# Patient Record
Sex: Male | Born: 1985 | Race: Black or African American | Hispanic: No | Marital: Single | Smoking: Current every day smoker
Health system: Southern US, Community
[De-identification: ages and names within clinical notes are randomized; demographics above are authoritative.]

---
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
## 2007-05-04 ENCOUNTER — Emergency Department (HOSPITAL_COMMUNITY): Admission: EM | Admit: 2007-05-04 | Discharge: 2007-05-04 | Payer: Self-pay | Admitting: Emergency Medicine

## 2010-12-04 ENCOUNTER — Emergency Department (HOSPITAL_COMMUNITY)
Admission: EM | Admit: 2010-12-04 | Discharge: 2010-12-04 | Disposition: A | Discharge status: Home / Self Care | Payer: Self-pay | Source: Home / Self Care

## 2010-12-04 ENCOUNTER — Encounter: Payer: Self-pay | Admitting: Emergency Medicine

## 2010-12-04 ENCOUNTER — Emergency Department (HOSPITAL_COMMUNITY)
Admission: EM | Admit: 2010-12-04 | Discharge: 2010-12-04 | Disposition: A | Discharge status: Home / Self Care | Payer: Self-pay | Attending: Emergency Medicine | Admitting: Emergency Medicine

## 2010-12-04 DIAGNOSIS — R21 Rash and other nonspecific skin eruption: Secondary | ICD-10-CM | POA: Insufficient documentation

## 2010-12-04 DIAGNOSIS — L089 Local infection of the skin and subcutaneous tissue, unspecified: Secondary | ICD-10-CM | POA: Insufficient documentation

## 2010-12-04 DIAGNOSIS — B009 Herpesviral infection, unspecified: Secondary | ICD-10-CM | POA: Insufficient documentation

## 2010-12-04 MED ORDER — NAPROXEN 500 MG PO TABS: 500.0000 mg | ORAL_TABLET | Freq: Two times a day (BID) | ORAL | Status: AC

## 2010-12-04 MED ORDER — ACYCLOVIR 400 MG PO TABS: 400.0000 mg | ORAL_TABLET | Freq: Four times a day (QID) | ORAL | Status: AC

## 2010-12-04 NOTE — ED Notes (Signed)
Patient has area on his right pelvic/groin area that is painful to touch.  He states it feels like a bruise.  He denies any trauma.  Patient denies any diff voiding.  No other swollen or painful areas on patient at this time

## 2010-12-04 NOTE — ED Notes (Signed)
Knot on rt hip x 3 days  Red and swollen

## 2010-12-04 NOTE — ED Provider Notes (Signed)
History     CSN: [id number] Arrival date & time: 12/04/2010 11:55 AM   First MD Initiated Contact with Patient 12/04/10 1252      Chief Complaint  Patient presents with  . Wound Infection    (Consider location/radiation/quality/duration/timing/severity/associated sxs/prior treatment) HPI Patient has noted a swollen area in his right groin area for 3 days. Patient states it's been sore and tender to the touch. He has not noticed any fevers. There's been no dysuria or penile drainage. Patient denies any abdominal pain nausea or vomiting. Patient states around the same time he also noticed a rash on the right upper buttock region. Patient states it somewhat itchy and painful. Symptoms have been constant. He been moderate in nature. Nothing seems to make it better or worse other than palpation of the area making it worse. History reviewed. No pertinent past medical history.  History reviewed. No pertinent past surgical history.  No family history on file.  History  Substance Use Topics  . Smoking status: Current Everyday Smoker  . Smokeless tobacco: Not on file  . Alcohol Use: No      Review of Systems  All other systems reviewed and are negative.    Allergies  Review of patient's allergies indicates no known allergies.  Home Medications   None  BP 103/63  Pulse 66  Temp(Src) 97.5 F (36.4 C) (Oral)  Resp 22  SpO2 100%  Physical Exam  Nursing note and vitals reviewed. Constitutional: He appears well-developed and well-nourished. No distress.  HENT:  Head: Normocephalic and atraumatic.  Right Ear: External ear normal.  Left Ear: External ear normal.  Eyes: Conjunctivae are normal. Right eye exhibits no discharge. Left eye exhibits no discharge. No scleral icterus.  Neck: Neck supple. No tracheal deviation present.  Cardiovascular: Normal rate and regular rhythm.   No murmur heard. Pulmonary/Chest: Effort normal. No stridor. No respiratory distress.  Abdominal:  He exhibits no distension. There is no tenderness. There is no rebound.       Right inguinal region small area of lymphadenopathy no fluctuance  Musculoskeletal: He exhibits no edema.  Neurological: He is alert. Cranial nerve deficit: no gross deficits.  Skin: Skin is warm and dry. Rash noted.       Cluster of vesicles and a few centimeter patch in the right upper buttock region  Psychiatric: He has a normal mood and affect.    ED Course  Procedures (including critical care time)  Labs Reviewed - No data to display No results found.   1. Herpes simplex       MDM  Appears consistent with herpes simplex-type rash. Patient denies any genital symptoms. The lump is actually lymph node. There is no evidence of hernia or abscess. Patient is given a prescription for acyclovir and Naprosyn. He is encouraged to follow up with primary care Dr.       Celene Kras, MD 12/04/10 1304

## 2016-10-10 ENCOUNTER — Emergency Department (HOSPITAL_COMMUNITY): Payer: Self-pay

## 2016-10-10 ENCOUNTER — Emergency Department (HOSPITAL_COMMUNITY)
Admission: EM | Admit: 2016-10-10 | Discharge: 2016-10-10 | Disposition: A | Discharge status: Home / Self Care | Payer: Self-pay | Attending: Emergency Medicine | Admitting: Emergency Medicine

## 2016-10-10 ENCOUNTER — Encounter (HOSPITAL_COMMUNITY): Payer: Self-pay | Admitting: Emergency Medicine

## 2016-10-10 DIAGNOSIS — Z23 Encounter for immunization: Secondary | ICD-10-CM | POA: Insufficient documentation

## 2016-10-10 DIAGNOSIS — F17200 Nicotine dependence, unspecified, uncomplicated: Secondary | ICD-10-CM | POA: Insufficient documentation

## 2016-10-10 DIAGNOSIS — B351 Tinea unguium: Secondary | ICD-10-CM | POA: Insufficient documentation

## 2016-10-10 DIAGNOSIS — L03031 Cellulitis of right toe: Secondary | ICD-10-CM | POA: Insufficient documentation

## 2016-10-10 MED ORDER — CEPHALEXIN 500 MG PO CAPS: ORAL_CAPSULE | ORAL | 0 refills | Status: DC

## 2016-10-10 MED ORDER — HYDROCODONE-ACETAMINOPHEN 5-325 MG PO TABS
1.0000 | ORAL_TABLET | Freq: Once | ORAL | Status: AC
  Administered 2016-10-10: 1 via ORAL
  Filled 2016-10-10: qty 1

## 2016-10-10 MED ORDER — TETANUS-DIPHTH-ACELL PERTUSSIS 5-2.5-18.5 LF-MCG/0.5 IM SUSP
0.5000 mL | Freq: Once | INTRAMUSCULAR | Status: AC
  Administered 2016-10-10: 0.5 mL via INTRAMUSCULAR
  Filled 2016-10-10: qty 0.5

## 2016-10-10 NOTE — ED Notes (Signed)
Pt removed all wires.

## 2016-10-10 NOTE — Discharge Instructions (Signed)
Take the antibiotic Keflex as directed, and until completed. Perform warm soapy water soaks in Half-strength hydrogen peroxide mixed with warm soapy water for 5-10 mins, 2-4 times daily for 5-7 days. Alternate between tylenol and motrin as needed for pain. Ice and elevate to help with pain and swelling. Keep the area clean and dry, and covered with topical antibiotic ointment and bandage. Wear post-op shoe for comfort. Please followup with the urgent care for wound check in 2-3 days. Follow up with the [facility] in 1 week for recheck and to establish medical care; also follow up with the podiatrist in 1-2 weeks for recheck of symptoms and ongoing management of your toenail issues. Watch for increasing pain, worsening swelling, spreading redness, drainage/pus, or fever, and return to the emergency department if any of these symptoms occur, or for any other changes/worsening.

## 2016-10-10 NOTE — ED Notes (Signed)
Patient left at this time with all belongings. 

## 2016-10-10 NOTE — ED Notes (Signed)
Continuing to soak foot in sterile water and betadine. Awaiting PA for I&D.

## 2016-10-10 NOTE — ED Provider Notes (Signed)
[facility]-EMERGENCY DEPT Provider Note   CSN: [id number] Arrival date & time: 10/10/16  0346     History   Chief Complaint Chief Complaint  Patient presents with  . Toe Pain    HPI Lance Russell is a 31 y.o. male with a PMHx of growth hormone deficiency, who presents to the ED with complaints of right middle toe pain that began Thursday night 1.5 days prior to evaluation. Patient states that he has had "a lot of issues with his toes for a while" but hasn't seen anyone for it. States that he accidentally scraped it on the cement ground Thursday night and this caused his nail to bend back. He attempted to cut the nail off and this made his pain gradually get worse, and caused some swelling and bruising. He describes his pain as 8/10 intermittent throbbing right middle toe pain that radiates into the foot, worse with hanging his foot down with gravity, improved mildly with walking, and with no treatments tried prior to arrival. He is not on any blood thinners. He is unsure of his last tetanus shot. He denies any other medical problems aside from growth hormone deficiency, denies having any immunocompromising conditions. He does not currently have a PCP due to lack of insurance. He also wants a referral to podiatry for his long-standing toenail issues. He denies any red streaking, erythema, warmth, drainage, or ongoing bleeding from the toe. He also denies any fevers, chills, CP, SOB, abd pain, N/V/D/C, hematuria, dysuria, numbness, tingling, focal weakness, or any other complaints at this time.    The history is provided by the patient and medical records. No language interpreter was used.  Toe Pain  This is a new problem. The current episode started 2 days ago (Thursday night). The problem occurs daily. The problem has been gradually worsening. Pertinent negatives include no chest pain, no abdominal pain and no shortness of breath. Exacerbated by: laying down with gravity. The symptoms are relieved  by walking. He has tried nothing for the symptoms. The treatment provided no relief.    History reviewed. No pertinent past medical history.  There are no active problems to display for this patient.   History reviewed. No pertinent surgical history.     Home Medications    Prior to Admission medications   Not on File    Family History No family history on file.  Social History Social History  Substance Use Topics  . Smoking status: Current Every Day Smoker  . Smokeless tobacco: Never Used  . Alcohol use No     Allergies   Patient has no known allergies.   Review of Systems Review of Systems  Constitutional: Negative for chills and fever.  Respiratory: Negative for shortness of breath.   Cardiovascular: Negative for chest pain.  Gastrointestinal: Negative for abdominal pain, constipation, diarrhea, nausea and vomiting.  Genitourinary: Negative for dysuria and hematuria.  Musculoskeletal: Positive for arthralgias and joint swelling.  Skin: Positive for color change and wound.  Allergic/Immunologic: Negative for immunocompromised state.  Neurological: Negative for weakness and numbness.  Hematological: Does not bruise/bleed easily.  Psychiatric/Behavioral: Negative for confusion.  All other systems reviewed and are negative for acute change except as noted in the HPI.     Physical Exam Updated Vital Signs BP 124/88 (BP Location: Right Arm)   Pulse 60   Temp 98.1 F (36.7 C)   Resp 16   SpO2 100%   Physical Exam  Constitutional: He is oriented to person, place, and  time. Vital signs are normal. He appears well-developed and well-nourished.  Non-toxic appearance. No distress.  Afebrile, nontoxic, NAD  HENT:  Head: Normocephalic and atraumatic.  Mouth/Throat: Mucous membranes are normal.  Eyes: Conjunctivae and EOM are normal. Right eye exhibits no discharge. Left eye exhibits no discharge.  Neck: Normal range of motion. Neck supple.  Cardiovascular:  Normal rate and intact distal pulses.   Pulmonary/Chest: Effort normal. No respiratory distress.  Abdominal: Normal appearance. He exhibits no distension.  Musculoskeletal: Normal range of motion.       Right foot: There is tenderness and swelling. There is normal range of motion, normal capillary refill, no crepitus, no deformity and no laceration.  Onychomycosis of toenails, hypertrophied thickened hardened yellowed nails. R middle toe with mild swelling and small pocket of fluctuance around nail edge, mild bruising, mild TTP, no erythema/warmth/red streaking, no drainage from the area, no lacerations/abrasions noted, top layers of the nail of this toe appear to be removed but bottom layers intact, and nailbed covered by nail that remains. No focal tenderness to remainder of foot. No crepitus or deformity, FROM intact in all joints of the toe. Strength and sensation grossly intact, distal pulses intact, soft compartments.   Neurological: He is alert and oriented to person, place, and time. He has normal strength. No sensory deficit.  Skin: Skin is warm, dry and intact. No rash noted.  Psychiatric: He has a normal mood and affect.  Nursing note and vitals reviewed.    ED Treatments / Results  Labs (all labs ordered are listed, but only abnormal results are displayed) Labs Reviewed - No data to display  EKG  EKG Interpretation None       Radiology No results found.  Procedures Drain paronychia Date/Time: 10/10/2016 6:27 AM Performed by: Rhona Raider Authorized by: Rhona Raider  Consent: Verbal consent obtained. Risks and benefits: risks, benefits and alternatives were discussed Consent given by: patient Patient understanding: patient states understanding of the procedure being performed Patient consent: the patient's understanding of the procedure matches consent given Patient identity confirmed: verbally with patient Local anesthesia used: no  Anesthesia: Local  anesthesia used: no  Sedation: Patient sedated: no Patient tolerance: Patient tolerated the procedure well with no immediate complications Comments: Soaked in water/betadine solution until skin softened, gently pushed skin of nail fold back using 18G needle until paronychia drained    (including critical care time)  Medications Ordered in ED Medications  Tdap (BOOSTRIX) injection 0.5 mL (0.5 mLs Intramuscular Given 10/10/16 0514)  HYDROcodone-acetaminophen (NORCO/VICODIN) 5-325 MG per tablet 1 tablet (1 tablet Oral Given 10/10/16 0514)     Initial Impression / Assessment and Plan / ED Course  I have reviewed the triage vital signs and the nursing notes.  Pertinent labs & imaging results that were available during my care of the patient were reviewed by me and considered in my medical decision making (see chart for details).     30 y.o. male here with R middle toe pain and swelling after he scraped it on the cement 1.5days ago and then tried to remove the nail that had gotten "bent". On exam, onychomycosis of nails, hardened hypertrophic yellowed nails, R middle toe with mild swelling around nail with small pocket of fluctuance, top portion of nail almost completely removed but underneath nail still remains on the nail bed; no lacerations, mild bruising, no erythema or warmth, no red streaking. FROM intact, soft compartments, NVI, mild TTP to distal toe. Appears as though he removed top  layer of hypertrophied nail but still left the underlying portion, and has a small paronychia. Will get xray, update Tdap, and soak foot in attempt to drain the paronychia. Will give pain meds and reassess shortly  6:30 AM Xray negative. After foot soak in water/betadine solution, skin near paronychia gently pushed back using 18G needle and successfully drained moderate amount of purulent drainage; skin gently peeled up away from nail in this area to allow for ongoing drainage. Doubt need for incision or  further deeper exploration. Will apply dressing and post-op shoe for comfort, will start on abx, advised tylenol/motrin for pain. Ice/elevation advised. Foot soaks with warm soapy water and half-strength peroxide advised. Will not start on antifungal meds since we would need baseline LFTs/labs, and this case will likely require longer duration of therapy to clear it up; will refer to [facility] and podiatry to establish medical care in 1-2wks and for ongoing management of this longstanding issue. Advised f/up with [facility] in 2-3 days for recheck of paronychia. Proper wound care advised. I explained the diagnosis and have given explicit precautions to return to the ER including for any other new or worsening symptoms. The patient understands and accepts the medical plan as it's been dictated and I have answered their questions. Discharge instructions concerning home care and prescriptions have been given. The patient is STABLE and is discharged to home in good condition.     Final Clinical Impressions(s) / ED Diagnoses   Final diagnoses:  Paronychia of third toe, right  Onychomycosis of toenail    New Prescriptions New Prescriptions   CEPHALEXIN (KEFLEX) 500 MG CAPSULE    2 caps po bid x 7 days     [address] [place], [place] 10/10/16 0631    Azalia Bilis, MD 10/10/16 [phone]

## 2016-10-10 NOTE — ED Notes (Signed)
PA at bedside.

## 2016-10-10 NOTE — ED Triage Notes (Signed)
Report from [facility]> pt states he scraped his R middle toe on cement yesterday and damaged nail.  C/o pain and swelling.

## 2017-02-03 ENCOUNTER — Encounter (HOSPITAL_COMMUNITY): Payer: Self-pay | Admitting: Emergency Medicine

## 2017-02-03 ENCOUNTER — Other Ambulatory Visit: Payer: Self-pay

## 2017-02-03 DIAGNOSIS — F17200 Nicotine dependence, unspecified, uncomplicated: Secondary | ICD-10-CM | POA: Insufficient documentation

## 2017-02-03 DIAGNOSIS — R0789 Other chest pain: Secondary | ICD-10-CM | POA: Insufficient documentation

## 2017-02-03 NOTE — ED Triage Notes (Signed)
Pt presents with pain to the R ribs that he describes as a cramping that causes increased pain with deep breathing; pt denies any strenuous exercise or heavy lifting; pt denies CP, n/v, diaphoresis

## 2017-02-04 ENCOUNTER — Emergency Department (HOSPITAL_COMMUNITY): Payer: Self-pay

## 2017-02-04 ENCOUNTER — Emergency Department (HOSPITAL_COMMUNITY)
Admission: EM | Admit: 2017-02-04 | Discharge: 2017-02-04 | Disposition: A | Discharge status: Home / Self Care | Payer: Self-pay | Attending: Emergency Medicine | Admitting: Emergency Medicine

## 2017-02-04 DIAGNOSIS — R0789 Other chest pain: Secondary | ICD-10-CM

## 2017-02-04 LAB — BASIC METABOLIC PANEL
ANION GAP: 7 (ref 5–15)
BUN: 10 mg/dL (ref 6–20)
CHLORIDE: 104 mmol/L (ref 101–111)
CO2: 24 mmol/L (ref 22–32)
Calcium: 9 mg/dL (ref 8.9–10.3)
Creatinine, Ser: 0.83 mg/dL (ref 0.61–1.24)
GFR calc non Af Amer: >60 mL/min (ref >60)
Glucose, Bld: 89 mg/dL (ref 65–99)
POTASSIUM: 4 mmol/L (ref 3.5–5.1)
Sodium: 135 mmol/L (ref 135–145)

## 2017-02-04 LAB — CBC
HEMATOCRIT: 40.2 % (ref 39.0–52.0)
HEMOGLOBIN: 12.9 g/dL — AB (ref 13.0–17.0)
MCH: 26.5 pg (ref 26.0–34.0)
MCHC: 32.1 g/dL (ref 30.0–36.0)
MCV: 82.7 fL (ref 78.0–100.0)
Platelets: 175 10*3/uL (ref 150–400)
RBC: 4.86 MIL/uL (ref 4.22–5.81)
RDW: 14.7 % (ref 11.5–15.5)
WBC: 5.9 10*3/uL (ref 4.0–10.5)

## 2017-02-04 LAB — I-STAT TROPONIN, ED: Troponin i, poc: 0 ng/mL (ref 0.00–0.08)

## 2017-02-04 MED ORDER — CYCLOBENZAPRINE HCL 10 MG PO TABS: 10.0000 mg | ORAL_TABLET | Freq: Two times a day (BID) | ORAL | 0 refills | Status: DC | PRN

## 2017-02-04 MED ORDER — IBUPROFEN 800 MG PO TABS
800.0000 mg | ORAL_TABLET | Freq: Three times a day (TID) | ORAL | 0 refills | Status: DC
Start: 2017-02-04 — End: 2020-08-29

## 2017-02-04 NOTE — ED Provider Notes (Signed)
[facility] EMERGENCY DEPARTMENT Provider Note   CSN: [id number] Arrival date & time: 02/03/17  2344     History   Chief Complaint Chief Complaint  Patient presents with  . Chest Pain  . Shortness of Breath    HPI Lance Russell is a 32 y.o. male.  Patient presents to the ED with a chief complaint of right sided chest pain.  He states that the symptoms started yesterday.  He reports pain with palpation and deep breathing.  He denies any recent illness, fever, cough, SOB, n/v/d.  He has not taken anything for the symptoms.  Denies any other medical problems.  Denies any injuries.   The history is provided by the patient. No language interpreter was used.    History reviewed. No pertinent past medical history.  There are no active problems to display for this patient.   History reviewed. No pertinent surgical history.     Home Medications    Prior to Admission medications   Medication Sig Start Date End Date Taking? Authorizing Provider  cephALEXin (KEFLEX) 500 MG capsule 2 caps po bid x 7 days 10/10/16   Street, [place], [place]    Family History History reviewed. No pertinent family history.  Social History Social History   Tobacco Use  . Smoking status: Current Every Day Smoker  . Smokeless tobacco: Never Used  Substance Use Topics  . Alcohol use: No  . Drug use: No     Allergies   Patient has no known allergies.   Review of Systems Review of Systems  All other systems reviewed and are negative.    Physical Exam Updated Vital Signs BP 104/64 (BP Location: Right Arm)   Pulse 63   Temp 98.8 F (37.1 C) (Oral)   Resp 16   Ht 5\' 1"  (1.549 m)   SpO2 99%   Physical Exam  Constitutional: He is oriented to person, place, and time. He appears well-developed and well-nourished.  HENT:  Head: Normocephalic and atraumatic.  Eyes: Conjunctivae and EOM are normal. Pupils are equal, round, and reactive to light. Right eye exhibits no  discharge. Left eye exhibits no discharge. No scleral icterus.  Neck: Normal range of motion. Neck supple. No JVD present.  Cardiovascular: Normal rate, regular rhythm and normal heart sounds. Exam reveals no gallop and no friction rub.  No murmur heard. Pulmonary/Chest: Effort normal and breath sounds normal. No respiratory distress. He has no wheezes. He has no rales. He exhibits no tenderness.  Abdominal: Soft. He exhibits no distension and no mass. There is no tenderness. There is no rebound and no guarding.  Musculoskeletal: Normal range of motion. He exhibits no edema or tenderness.  Neurological: He is alert and oriented to person, place, and time.  Skin: Skin is warm and dry.  Psychiatric: He has a normal mood and affect. His behavior is normal. Judgment and thought content normal.  Nursing note and vitals reviewed.    ED Treatments / Results  Labs (all labs ordered are listed, but only abnormal results are displayed) Labs Reviewed  CBC - Abnormal; Notable for the following components:      Result Value   Hemoglobin 12.9 (*)    All other components within normal limits  BASIC METABOLIC PANEL  I-STAT TROPONIN, ED    EKG  EKG Interpretation None      ED ECG REPORT  I personally interpreted this EKG   Date: 02/04/2017   Rate: 64  Rhythm: normal sinus rhythm  QRS Axis: normal  Intervals: normal  ST/T Wave abnormalities: normal  Conduction Disutrbances:none  Narrative Interpretation:   Old EKG Reviewed: none available   Radiology Dg Chest 2 View  Result Date: 02/04/2017 CLINICAL DATA:  32 year old male with shortness of breath. EXAM: CHEST  2 VIEW COMPARISON:  None. FINDINGS: The heart size and mediastinal contours are within normal limits. Both lungs are clear. The visualized skeletal structures are unremarkable. IMPRESSION: No active cardiopulmonary disease. Electronically Signed   By: Elgie CollardArash  Radparvar M.D.   On: 02/04/2017 02:23    Procedures Procedures  (including critical care time)  Medications Ordered in ED Medications - No data to display   Initial Impression / Assessment and Plan / ED Course  I have reviewed the triage vital signs and the nursing notes.  Pertinent labs & imaging results that were available during my care of the patient were reviewed by me and considered in my medical decision making (see chart for details).     Patient presents with chest pain x 1 day, it is worse with movement and palpation.   DDx includes ACS, PE, pneumothorax, aortic dissection, esophageal rupture, pericarditis, chest wall pain.  Doubt ACS, normal troponin, no ischemic EKG findings, HEART score is: 0.  Low risk for PE, PERC negative, patient is not tachycardic nor hypoxic.  No evidence of pneumothorax on CXR.  Doubt dissection, no mediastinal widening on CXR, no ripping/tearing chest pain, neurovascularly intact.  Doubt pericarditis, no positional changes, or diffuse ST elevations on EKG.  Pain is not reproducible, doubt MSK.  Recommend close follow-up with PCP.    All findings were discussed with patient.  Patient understands and agrees with the plan.     Final Clinical Impressions(s) / ED Diagnoses   Final diagnoses:  Chest wall pain    ED Discharge Orders        Ordered    ibuprofen (ADVIL,MOTRIN) 800 MG tablet  3 times daily     02/04/17 0416    cyclobenzaprine (FLEXERIL) 10 MG tablet  2 times daily PRN     02/04/17 0416       Roxy HorsemanBrowning, Hailyn Zarr, PA-C 02/04/17 0416    Dione BoozeGlick, David, MD 02/04/17 [phone]

## 2019-06-08 ENCOUNTER — Emergency Department (HOSPITAL_COMMUNITY)
Admission: EM | Admit: 2019-06-08 | Discharge: 2019-06-08 | Disposition: A | Discharge status: Home / Self Care | Payer: Self-pay | Attending: Emergency Medicine | Admitting: Emergency Medicine

## 2019-06-08 ENCOUNTER — Other Ambulatory Visit: Payer: Self-pay

## 2019-06-08 ENCOUNTER — Encounter (HOSPITAL_COMMUNITY): Payer: Self-pay | Admitting: Pediatrics

## 2019-06-08 DIAGNOSIS — L249 Irritant contact dermatitis, unspecified cause: Secondary | ICD-10-CM | POA: Insufficient documentation

## 2019-06-08 DIAGNOSIS — F17200 Nicotine dependence, unspecified, uncomplicated: Secondary | ICD-10-CM | POA: Insufficient documentation

## 2019-06-08 DIAGNOSIS — R21 Rash and other nonspecific skin eruption: Secondary | ICD-10-CM

## 2019-06-08 MED ORDER — PREDNISONE 10 MG PO TABS: 40.0000 mg | ORAL_TABLET | Freq: Every day | ORAL | 0 refills | Status: DC

## 2019-06-08 MED ORDER — CETIRIZINE HCL 5 MG PO TABS: 5.0000 mg | ORAL_TABLET | Freq: Every day | ORAL | 0 refills | Status: DC

## 2019-06-08 NOTE — ED Provider Notes (Signed)
[facility] EMERGENCY DEPARTMENT Provider Note   CSN: [id number] Arrival date & time: 06/08/19  1527     History Chief Complaint  Patient presents with  . Rash    Lance Russell is a 34 y.o. male.  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  He states it has been intermittent since.  It seems to come and go and is associated with small bumps that are itchy.  He states that he has been scratching significantly on his left forearm and has scratched a small lesion on his arm.  Patient has any fever, chills, nausea, vomiting denies any wheezing, shortness of breath or chest pain.  Patient works in Therapist, [employer] care and states that he wears shorts and short sleeve shirts when he is working.  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  States that he has no known exposure to poison ivy/poison oak.     History reviewed. No pertinent past medical history.  There are no problems to display for this patient.   History reviewed. No pertinent surgical history.     No family history on file.  Social History   Tobacco Use  . Smoking status: Current Every Day Smoker  . Smokeless tobacco: Never Used  Substance Use Topics  . Alcohol use: No  . Drug use: No    Home Medications Prior to Admission medications   Medication Sig Start Date End Date Taking? Authorizing Provider  cephALEXin (KEFLEX) 500 MG capsule 2 caps po bid x 7 days 10/10/16   Street, [place], PA-C  cetirizine (ZYRTEC) 5 MG tablet Take 1 tablet (5 mg total) by mouth daily for 7 days. 06/08/19 06/15/19  Gailen Shelter, PA  cyclobenzaprine (FLEXERIL) 10 MG tablet Take 1 tablet (10 mg total) by mouth 2 (two) times daily as needed for muscle spasms. 02/04/17   Roxy Horseman, PA-C  ibuprofen (ADVIL,MOTRIN) 800 MG  tablet Take 1 tablet (800 mg total) by mouth 3 (three) times daily. 02/04/17   Roxy Horseman, PA-C  predniSONE (DELTASONE) 10 MG tablet Take 4 tablets (40 mg total) by mouth daily. 06/08/19   Gailen Shelter, PA    Allergies    Patient has no known allergies.  Review of Systems   Review of Systems  Constitutional: Negative for chills and fever.  HENT: Negative for congestion.   Respiratory: Negative for shortness of breath.   Cardiovascular: Negative for chest pain.  Gastrointestinal: Negative for abdominal pain.  Musculoskeletal: Negative for neck pain.  Skin: Positive for rash.    Physical Exam Updated Vital Signs BP 114/75   Pulse 71   Temp 98.4 F (36.9 C) (Oral)   Resp 14   Ht 5\' 2"  (1.575 m)   SpO2 100%   Physical Exam Vitals and nursing note reviewed.  Constitutional:      General: He is not in acute distress.    Appearance: Normal appearance. He is not ill-appearing.  HENT:     Head: Normocephalic and atraumatic.  Eyes:     General: No scleral icterus.       Right eye: No discharge.        Left eye: No discharge.     Conjunctiva/sclera: Conjunctivae normal.  Pulmonary:     Effort: Pulmonary effort is normal.  Breath sounds: No stridor.  Skin:    General: Skin is warm and dry.     Comments: Diffuse small papules to the bilateral forearms. No rash noted to the scalp, neck or legs.  Neurological:     Mental Status: He is alert and oriented to person, place, and time. Mental status is at baseline.     ED Results / Procedures / Treatments   Labs (all labs ordered are listed, but only abnormal results are displayed) Labs Reviewed - No data to display  EKG None  Radiology No results found.  Procedures Procedures (including critical care time)  Medications Ordered in ED Medications - No data to display  ED Course  I have reviewed the triage vital signs and the nursing notes.  Pertinent labs & imaging results that were available during my  care of the patient were reviewed by me and considered in my medical decision making (see chart for details).    MDM Rules/Calculators/A&P                      Patient is well-appearing 34 year old male with benign-appearing papular rash on the forearms with itching of the forearms and the back of the neck.  He works in Teacher, adult education care and states that it has come and gone over the past week seems to be worse after he works.  Suspect that this is irritant dermatitis.  He has no known change in household allergens such as soaps, detergents, animals.  Will provide patient with 5 days of prednisone, recommend Benadryl and cetirizine.  Patient will follow up with [place] and wellness clinic.  To self-care.  No evidence of bacterial superinfection.  No evidence of wound infection.  Doubt fungal or bacterial infection.  Doubt viral exanthem.  Patient given return precautions.  He is agreeable to plan and will follow up with PCP.    Final Clinical Impression(s) / ED Diagnoses Final diagnoses:  Rash  Irritant contact dermatitis, unspecified trigger    Rx / DC Orders ED Discharge Orders         Ordered    cetirizine (ZYRTEC) 5 MG tablet  Daily     06/08/19 1653    predniSONE (DELTASONE) 10 MG tablet  Daily     06/08/19 [place], Darvis Croft [place], [place] 06/08/19 1654    Carmin Muskrat, MD 06/08/19 2349

## 2019-06-08 NOTE — ED Triage Notes (Signed)
C/O itchy rash all over body; comes and go x 1 week.

## 2019-06-08 NOTE — Discharge Instructions (Addendum)
Please call to make an appointment with the [facility] health and wellness clinic.  I have attached information for you.  Please take Benadryl 25-50 mg every 6 hours.  I recommend that you start with 50 mg and taper down to 25 mg every 6 hours.  Please wear protective long sleeve shirt and pants.

## 2019-06-08 NOTE — ED Notes (Signed)
Patient going to car and will return back shortly

## 2019-12-12 ENCOUNTER — Encounter (HOSPITAL_COMMUNITY): Payer: Self-pay | Admitting: Emergency Medicine

## 2019-12-12 ENCOUNTER — Other Ambulatory Visit: Payer: Self-pay

## 2019-12-12 ENCOUNTER — Emergency Department (HOSPITAL_COMMUNITY)
Admission: EM | Admit: 2019-12-12 | Discharge: 2019-12-12 | Disposition: A | Discharge status: Home / Self Care | Payer: Self-pay | Attending: Emergency Medicine | Admitting: Emergency Medicine

## 2019-12-12 ENCOUNTER — Emergency Department (HOSPITAL_COMMUNITY): Payer: Self-pay

## 2019-12-12 DIAGNOSIS — F17200 Nicotine dependence, unspecified, uncomplicated: Secondary | ICD-10-CM | POA: Insufficient documentation

## 2019-12-12 DIAGNOSIS — M79672 Pain in left foot: Secondary | ICD-10-CM

## 2019-12-12 NOTE — ED Provider Notes (Signed)
[facility] EMERGENCY DEPARTMENT Provider Note   CSN: [id number] Arrival date & time: 12/12/19  1854     History Chief Complaint  Patient presents with  . Foot Pain    Lance Russell is a 34 y.o. male with no significant past medical history presents to the ED for evaluation of left foot pain for the last 2 to 3 weeks.  Described as a "tightness" and throbbing pain on the top of his foot around the third and fourth toes associated with swelling.  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  Does not specifically remember dropping anything on his feet or any injuries but states he "may" have twisted his foot at work and just does not remember.  No interventions.  Pain is worse with weightbearing and palpation.  No radiation of pain up the calf or leg.  No associated calf swelling.  No redness, warmth, fever.  No history of gout. No history of diabetes.   HPI     History reviewed. No pertinent past medical history.  There are no problems to display for this patient.   History reviewed. No pertinent surgical history.     No family history on file.  Social History   Tobacco Use  . Smoking status: Current Every Day Smoker  . Smokeless tobacco: Never Used  Substance Use Topics  . Alcohol use: No  . Drug use: No    Home Medications Prior to Admission medications   Medication Sig Start Date End Date Taking? Authorizing Provider  cephALEXin (KEFLEX) 500 MG capsule 2 caps po bid x 7 days 10/10/16   Street, [place], PA-C  cetirizine (ZYRTEC) 5 MG tablet Take 1 tablet (5 mg total) by mouth daily for 7 days. 06/08/19 06/15/19  Gailen Shelter, PA  cyclobenzaprine (FLEXERIL) 10 MG tablet Take 1 tablet (10 mg total) by mouth 2 (two) times daily as needed for muscle spasms. 02/04/17   Roxy Horseman, PA-C  ibuprofen (ADVIL,MOTRIN) 800 MG tablet Take 1 tablet (800 mg total) by mouth 3 (three)  times daily. 02/04/17   Roxy Horseman, PA-C  predniSONE (DELTASONE) 10 MG tablet Take 4 tablets (40 mg total) by mouth daily. 06/08/19   Gailen Shelter, PA    Allergies    Patient has no known allergies.  Review of Systems   Review of Systems  Musculoskeletal: Positive for arthralgias and joint swelling.  All other systems reviewed and are negative.   Physical Exam Updated Vital Signs BP 124/73 (BP Location: Left Arm)   Pulse 74   Temp 98.3 F (36.8 C) (Oral)   Resp 16   Ht 5' (1.524 m)   Wt 75 kg   SpO2 100%   BMI 32.29 kg/m   Physical Exam Constitutional:      Appearance: He is well-developed.  HENT:     Head: Normocephalic.     Nose: Nose normal.  Eyes:     General: Lids are normal.  Cardiovascular:     Rate and Rhythm: Normal rate.     Comments: 1+ DP and PT pulses LLE  Pulmonary:     Effort: Pulmonary effort is normal. No respiratory distress.  Musculoskeletal:        General: Normal range of motion.     Cervical back: Normal range of motion.  Skin:    Comments: Hyperpigmented dry peeling skin bilateral feet/toes.  Hyperpigmented and thickened toes bilaterally.  Skin between toes macerated but no erythema, warmth, discharge.  No abscess. Mild focal edema, tenderness over dorsal 3rd and 4th metatarsals. Patient with patient with toe flexion/extension.  Sole of foot with one callus but no wounds, abscess. No focal bony tenderness of ankles, calf. Calf supple and non tender, without edema   Neurological:     Mental Status: He is alert.     Comments: Sensation and strength intact in LLE  Psychiatric:        Behavior: Behavior normal.     ED Results / Procedures / Treatments   Labs (all labs ordered are listed, but only abnormal results are displayed) Labs Reviewed - No data to display  EKG None  Radiology DG Foot Complete Left  Result Date: 12/12/2019 CLINICAL DATA:  Pain EXAM: LEFT FOOT - COMPLETE 3+ VIEW COMPARISON:  None. FINDINGS: There is no  evidence of fracture or dislocation. There is no evidence of arthropathy or other focal bone abnormality. Soft tissues are unremarkable. IMPRESSION: Negative. Electronically Signed   By: Jonna Clark M.D.   On: 12/12/2019 20:11    Procedures Procedures (including critical care time)  Medications Ordered in ED Medications - No data to display  ED Course  I have reviewed the triage vital signs and the nursing notes.  Pertinent labs & imaging results that were available during my care of the patient were reviewed by me and considered in my medical decision making (see chart for details).    MDM Rules/Calculators/A&P                          Left foot pain and swelling over dorsal 3rd and 4th metatarsals. Patient reports he may have twisted his foot at work. Prolonged standing at work. No signs of infection on exam, wounds. Normal distal pulse/sensation. No abscess noted. Doubt gout. Not consistent with septic arthritis, DVT. X-ray obtained in triage personally visualized and interpreted - negative.  Discussed with patient may be soft tissue injury vs occult stress fracture given his job. Recommended NSAID, ice, cam walker and repeat x-rays/evaluation by podiatry/ortho or PCP.  Work note given.    2245: I went to discharge patient, he was frustrated due to wait time to be discharged. Explained at this time RN was busy with several other patients. He was pacing up and down hall. Asked him to sit but he raised his voice, swearing and insulting RN. Warned him that this behavior would not be tolerated here in the ER, asked him to sit back in his hall bed to wait for cam walker. He continued to be disrespectful to RN. Security escorted patient out.   Final Clinical Impression(s) / ED Diagnoses Final diagnoses:  Left foot pain    Rx / DC Orders ED Discharge Orders    None       Liberty Handy, PA-C 12/12/19 2247    Virgina Norfolk, DO 12/12/19 2301

## 2019-12-12 NOTE — ED Notes (Signed)
Pt escorted out by security

## 2019-12-12 NOTE — ED Triage Notes (Signed)
Patient reports left foot pain this week , denies injury /ambulatory , patient stated " it feels tight" with mild swelling .

## 2019-12-12 NOTE — Discharge Instructions (Addendum)
You were seen in the ER for foot pain  X-ray was normal  Cause of your pain is unclear, it may be inflammation from overuse or a lot of walking. We discussed there may be a small hairline or stress fracture on your foot.  Wear your cam walker at least for 5-7 days. Rest. Elevate the foot. Ice as much as possible. Alternate ibuprofen 600 mg or 1000 mg acetaminophen every 6 hours for the next 5 days.    You need to be seen by foot specialist for re-evaluation and possible repeat x-rays in the next 7 days  Return for worsening or new symptoms, redness, warmth, push, fevers, swelling or pain extending up to the calf or leg

## 2019-12-12 NOTE — ED Notes (Signed)
Patient threatening staff. Security called. Patient escorted off premises before discharge vitals could be obtained.

## 2019-12-15 ENCOUNTER — Other Ambulatory Visit: Payer: Self-pay

## 2019-12-15 ENCOUNTER — Ambulatory Visit (HOSPITAL_COMMUNITY)
Admission: EM | Admit: 2019-12-15 | Discharge: 2019-12-15 | Disposition: A | Discharge status: Home / Self Care | Payer: Self-pay

## 2019-12-16 ENCOUNTER — Encounter (HOSPITAL_COMMUNITY): Payer: Self-pay

## 2019-12-16 ENCOUNTER — Ambulatory Visit (HOSPITAL_COMMUNITY)
Admission: EM | Admit: 2019-12-16 | Discharge: 2019-12-16 | Disposition: A | Discharge status: Home / Self Care | Payer: Self-pay | Attending: Family Medicine | Admitting: Family Medicine

## 2019-12-16 ENCOUNTER — Other Ambulatory Visit: Payer: Self-pay

## 2019-12-16 ENCOUNTER — Ambulatory Visit (INDEPENDENT_AMBULATORY_CARE_PROVIDER_SITE_OTHER): Payer: Self-pay

## 2019-12-16 DIAGNOSIS — B070 Plantar wart: Secondary | ICD-10-CM

## 2019-12-16 DIAGNOSIS — M79672 Pain in left foot: Secondary | ICD-10-CM

## 2019-12-16 DIAGNOSIS — L03116 Cellulitis of left lower limb: Secondary | ICD-10-CM

## 2019-12-16 DIAGNOSIS — B353 Tinea pedis: Secondary | ICD-10-CM

## 2019-12-16 MED ORDER — KETOROLAC TROMETHAMINE 60 MG/2ML IM SOLN
60.0000 mg | Freq: Once | INTRAMUSCULAR | Status: AC
  Administered 2019-12-16: 60 mg via INTRAMUSCULAR

## 2019-12-16 MED ORDER — IBUPROFEN 800 MG PO TABS: 800.0000 mg | ORAL_TABLET | Freq: Three times a day (TID) | ORAL | 0 refills | Status: DC | PRN

## 2019-12-16 MED ORDER — KETOCONAZOLE 2 % EX CREA: 1.0000 "application " | TOPICAL_CREAM | Freq: Two times a day (BID) | CUTANEOUS | 0 refills | Status: DC | PRN

## 2019-12-16 MED ORDER — SULFAMETHOXAZOLE-TRIMETHOPRIM 800-160 MG PO TABS: 1.0000 | ORAL_TABLET | Freq: Two times a day (BID) | ORAL | 0 refills | Status: AC

## 2019-12-16 MED ORDER — KETOROLAC TROMETHAMINE 60 MG/2ML IM SOLN
INTRAMUSCULAR | Status: AC
  Filled 2019-12-16: qty 2

## 2019-12-16 NOTE — ED Triage Notes (Signed)
Pt presents with pain and swelling in the left foot. States the pain started in the left big toe. Pt denies any trauma. Pt reports he works in [employer] and is missing his work as he is not able to wear the boots because of the pain. Advil gives no relief. Pt wearing a post op shoes, relief pain,  his sister gave it to him.

## 2019-12-16 NOTE — ED Provider Notes (Signed)
[facility]    CSN: [id number] Arrival date & time: 12/16/19  1010      History   Chief Complaint Chief Complaint  Patient presents with  . Foot Pain    HPI Lance Russell is a 34 y.o. male.   Presenting today for 1 week hx of progressively worsening swelling, redness and pain of left foot at base of 1-3rd toes extending now into metatarsals. 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 Denies fever, chills, sweats, swelling or pain up into leg, numbness or tingling of foot. Has tried OTC pain relievers with some relief. Notes his boots for work make the pain significantly worse so he's been unable to work recently.      History reviewed. No pertinent past medical history.  There are no problems to display for this patient.   History reviewed. No pertinent surgical history.     Home Medications    Prior to Admission medications   Medication Sig Start Date End Date Taking? Authorizing Provider  cephALEXin (KEFLEX) 500 MG capsule 2 caps po bid x 7 days 10/10/16   Street, [place], PA-C  cetirizine (ZYRTEC) 5 MG tablet Take 1 tablet (5 mg total) by mouth daily for 7 days. 06/08/19 06/15/19  Gailen Shelter, PA  cyclobenzaprine (FLEXERIL) 10 MG tablet Take 1 tablet (10 mg total) by mouth 2 (two) times daily as needed for muscle spasms. 02/04/17   Roxy Horseman, PA-C  ibuprofen (ADVIL) 800 MG tablet Take 1 tablet (800 mg total) by mouth every 8 (eight) hours as needed. 12/16/19   Particia Nearing, PA-C  ibuprofen (ADVIL,MOTRIN) 800 MG tablet Take 1 tablet (800 mg total) by mouth 3 (three) times daily. 02/04/17   Roxy Horseman, PA-C  ketoconazole (NIZORAL) 2 % cream Apply 1 application topically 2 (two) times daily as needed for irritation. 12/16/19   Particia Nearing, PA-C  predniSONE (DELTASONE) 10 MG tablet  Take 4 tablets (40 mg total) by mouth daily. 06/08/19   Gailen Shelter, PA  sulfamethoxazole-trimethoprim (BACTRIM DS) 800-160 MG tablet Take 1 tablet by mouth 2 (two) times daily for 7 days. 12/16/19 12/23/19  Particia Nearing, PA-C    Family History History reviewed. No pertinent family history.  Social History Social History   Tobacco Use  . Smoking status: Current Every Day Smoker  . Smokeless tobacco: Never Used  Substance Use Topics  . Alcohol use: No  . Drug use: No     Allergies   Patient has no known allergies.   Review of Systems Review of Systems PER HPI    Physical Exam Triage Vital Signs ED Triage Vitals  Enc Vitals Group     BP 12/16/19 1108 106/71     Pulse Rate 12/16/19 1108 71     Resp 12/16/19 1108 16     Temp 12/16/19 1108 98.1 F (36.7 C)     Temp Source 12/16/19 1108 Oral     SpO2 12/16/19 1108 100 %     Weight --      Height --      Head Circumference --      Peak Flow --      Pain Score 12/16/19 1107 8     Pain Loc --      Pain Edu? --      Excl. in GC? --  No data found.  Updated Vital Signs BP 106/71 (BP Location: Right Arm)   Pulse 71   Temp 98.1 F (36.7 C) (Oral)   Resp 16   SpO2 100%   Visual Acuity Right Eye Distance:   Left Eye Distance:   Bilateral Distance:    Right Eye Near:   Left Eye Near:    Bilateral Near:     Physical Exam Vitals and nursing note reviewed.  Constitutional:      Appearance: Normal appearance.  HENT:     Head: Atraumatic.  Eyes:     Extraocular Movements: Extraocular movements intact.     Conjunctiva/sclera: Conjunctivae normal.  Cardiovascular:     Rate and Rhythm: Normal rate and regular rhythm.  Pulmonary:     Effort: Pulmonary effort is normal.     Breath sounds: Normal breath sounds.  Musculoskeletal:        General: Swelling (left foot from base of 1-3rd digits extending down into metatarsals) and tenderness present. No deformity or signs of injury. Normal range of  motion.     Cervical back: Normal range of motion and neck supple.     Comments: Good ROM to area, sensation and strength intact, no drainage  Skin:    General: Skin is warm and dry.     Findings: Erythema present.     Comments: Significant tinea pedis across toes and bottoms of feet, numerous plantar warts across feet, cracking in numerous areas between toes  Neurological:     General: No focal deficit present.     Mental Status: He is oriented to person, place, and time.  Psychiatric:        Mood and Affect: Mood normal.        Thought Content: Thought content normal.        Judgment: Judgment normal.      UC Treatments / Results  Labs (all labs ordered are listed, but only abnormal results are displayed) Labs Reviewed - No data to display  EKG   Radiology DG Foot Complete Left  Result Date: 12/16/2019 CLINICAL DATA:  Pain and swelling. Redness across first through third toes. No known injury. EXAM: LEFT FOOT - COMPLETE 3+ VIEW COMPARISON:  None. FINDINGS: There is no evidence of fracture or dislocation. There is no evidence of arthropathy or other focal bone abnormality. Soft tissues are unremarkable. IMPRESSION: Negative. Electronically Signed   By: Gerome Sam III M.D   On: 12/16/2019 11:55    Procedures Procedures (including critical care time)  Medications Ordered in UC Medications  ketorolac (TORADOL) injection 60 mg (60 mg Intramuscular Given 12/16/19 1206)    Initial Impression / Assessment and Plan / UC Course  I have reviewed the triage vital signs and the nursing notes.  Pertinent labs & imaging results that were available during my care of the patient were reviewed by me and considered in my medical decision making (see chart for details).     Vitals stable, x-ray without evidence of bony injury. Suspect cellulitis forming from cracking skin from tinea pedis infection. Will tx with bactrim, ketoconazole cream, antiinflammatories, and good home care.  Discussed changing socks and shoes often, keeping feet dry. Podiatry info given, f/u next week with them. Return if worsening in meantime.   Final Clinical Impressions(s) / UC Diagnoses   Final diagnoses:  Foot pain, left  Tinea pedis of both feet  Plantar wart  Cellulitis of left lower extremity     Discharge Instructions  [facility] [address] [place], [place], [place] [address] [phone]    ED Prescriptions    Medication Sig Dispense Auth. Provider   sulfamethoxazole-trimethoprim (BACTRIM DS) 800-160 MG tablet Take 1 tablet by mouth 2 (two) times daily for 7 days. 14 tablet Particia Nearing, [place]   ketoconazole (NIZORAL) 2 % cream Apply 1 application topically 2 (two) times daily as needed for irritation. 90 g Particia Nearing, [place]   ibuprofen (ADVIL) 800 MG tablet Take 1 tablet (800 mg total) by mouth every 8 (eight) hours as needed. 21 tablet Particia Nearing, [place]     PDMP not reviewed this encounter.   Particia Nearing, [place] 12/16/19 1248

## 2019-12-16 NOTE — Discharge Instructions (Signed)
[facility] [address], [place], [place] [address] [phone] 

## 2020-08-29 ENCOUNTER — Other Ambulatory Visit: Payer: Self-pay

## 2020-08-29 ENCOUNTER — Encounter (HOSPITAL_COMMUNITY): Payer: Self-pay

## 2020-08-29 ENCOUNTER — Ambulatory Visit (HOSPITAL_COMMUNITY)
Admission: EM | Admit: 2020-08-29 | Discharge: 2020-08-29 | Disposition: A | Discharge status: Home / Self Care | Payer: Self-pay | Attending: Internal Medicine | Admitting: Internal Medicine

## 2020-08-29 DIAGNOSIS — U071 COVID-19: Secondary | ICD-10-CM | POA: Insufficient documentation

## 2020-08-29 DIAGNOSIS — B349 Viral infection, unspecified: Secondary | ICD-10-CM

## 2020-08-29 LAB — SARS CORONAVIRUS 2 (TAT 6-24 HRS): SARS Coronavirus 2: POSITIVE — AB

## 2020-08-29 MED ORDER — IBUPROFEN 600 MG PO TABS: 600.0000 mg | ORAL_TABLET | Freq: Four times a day (QID) | ORAL | 0 refills | Status: DC | PRN

## 2020-08-29 MED ORDER — ONDANSETRON 4 MG PO TBDP: 4.0000 mg | ORAL_TABLET | Freq: Three times a day (TID) | ORAL | 0 refills | Status: DC | PRN

## 2020-08-29 NOTE — ED Triage Notes (Signed)
Pt presents with c/o chills and body aches x 2 days.   Pt states he has not taken medicine for relief.

## 2020-08-29 NOTE — ED Provider Notes (Signed)
[facility]    CSN: [id number] Arrival date & time: 08/29/20  0907      History   Chief Complaint Chief Complaint  Patient presents with   Generalized Body Aches   Chills    HPI Lance Russell is a 35 y.o. male comes to the urgent care with a 1 day history of generalized body aches, chills and nausea of 1 day duration.  Patient symptoms started yesterday and has been persistent.  He denies any fever.  He had a temperature of 100 Fahrenheit in the urgent care.  He had 1 loose bowel movement this morning.  No dizziness.  No vomiting.  He admits to having some nausea.  Patient is not vaccinated against COVID-19 virus.  He denies any sick contacts.  He has not tried any over-the-counter medications. HPI  History reviewed. No pertinent past medical history.  There are no problems to display for this patient.   History reviewed. No pertinent surgical history.     Home Medications    Prior to Admission medications   Medication Sig Start Date End Date Taking? Authorizing Provider  ibuprofen (ADVIL) 600 MG tablet Take 1 tablet (600 mg total) by mouth every 6 (six) hours as needed. 08/29/20  Yes Dearra Myhand, Britta Mccreedy, MD  ondansetron (ZOFRAN ODT) 4 MG disintegrating tablet Take 1 tablet (4 mg total) by mouth every 8 (eight) hours as needed for nausea or vomiting. 08/29/20  Yes Elroy Schembri, Britta Mccreedy, MD  cetirizine (ZYRTEC) 5 MG tablet Take 1 tablet (5 mg total) by mouth daily for 7 days. 06/08/19 06/15/19  Gailen Shelter, PA  ketoconazole (NIZORAL) 2 % cream Apply 1 application topically 2 (two) times daily as needed for irritation. 12/16/19   Particia Nearing, PA-C    Family History Family History  Family history unknown: Yes    Social History Social History   Tobacco Use   Smoking status: Every Day   Smokeless tobacco: Never  Substance Use Topics   Alcohol use: No   Drug use: No     Allergies   Patient has no known allergies.   Review of Systems Review of  Systems  Constitutional:  Positive for chills.  Respiratory: Negative.    Gastrointestinal:  Positive for diarrhea and nausea.  Musculoskeletal:  Positive for arthralgias and myalgias.  Skin: Negative.     Physical Exam Triage Vital Signs ED Triage Vitals  Enc Vitals Group     BP 08/29/20 1002 122/77     Pulse Rate 08/29/20 1002 79     Resp 08/29/20 1002 20     Temp 08/29/20 1002 100 F (37.8 C)     Temp Source 08/29/20 1002 Oral     SpO2 08/29/20 1002 96 %     Weight --      Height --      Head Circumference --      Peak Flow --      Pain Score 08/29/20 1000 10     Pain Loc --      Pain Edu? --      Excl. in GC? --    No data found.  Updated Vital Signs BP 122/77 (BP Location: Left Arm)   Pulse 79   Temp 100 F (37.8 C) (Oral)   Resp 20   SpO2 96%   Visual Acuity Right Eye Distance:   Left Eye Distance:   Bilateral Distance:    Right Eye Near:   Left Eye Near:  Bilateral Near:     Physical Exam Vitals and nursing note reviewed.  Constitutional:      General: He is not in acute distress.    Appearance: He is not ill-appearing.  Cardiovascular:     Rate and Rhythm: Normal rate and regular rhythm.     Pulses: Normal pulses.     Heart sounds: Normal heart sounds.  Pulmonary:     Effort: Pulmonary effort is normal.     Breath sounds: Normal breath sounds.  Neurological:     Mental Status: He is alert.     UC Treatments / Results  Labs (all labs ordered are listed, but only abnormal results are displayed) Labs Reviewed  SARS CORONAVIRUS 2 (TAT 6-24 HRS)    EKG   Radiology No results found.  Procedures Procedures (including critical care time)  Medications Ordered in UC Medications - No data to display  Initial Impression / Assessment and Plan / UC Course  I have reviewed the triage vital signs and the nursing notes.  Pertinent labs & imaging results that were available during my care of the patient were reviewed by me and considered  in my medical decision making (see chart for details).     1.  Acute viral illness: I suspect this is COVID-19 viral infection COVID-19 PCR test has been sent Ibuprofen as needed for generalized body aches Zofran as needed for nausea Patient is advised to quarantine until COVID-19 test results available Return to urgent care if symptoms worsen. Final Clinical Impressions(s) / UC Diagnoses   Final diagnoses:  Acute viral syndrome     Discharge Instructions      Increase oral fluid intake Take medications as prescribed) Quarantine until COVID-19 test results available Return to urgent care if symptoms worsen We will call you with recommendations if labs are abnormal.   ED Prescriptions     Medication Sig Dispense Auth. Provider   ibuprofen (ADVIL) 600 MG tablet Take 1 tablet (600 mg total) by mouth every 6 (six) hours as needed. 30 tablet Aarion Kittrell, Britta Mccreedy, MD   ondansetron (ZOFRAN ODT) 4 MG disintegrating tablet Take 1 tablet (4 mg total) by mouth every 8 (eight) hours as needed for nausea or vomiting. 20 tablet Captola Teschner, Britta Mccreedy, MD      PDMP not reviewed this encounter.   Merrilee Jansky, MD 08/29/20 1150

## 2020-08-29 NOTE — Discharge Instructions (Addendum)
Increase oral fluid intake Take medications as prescribed) Quarantine until COVID-19 test results available Return to urgent care if symptoms worsen We will call you with recommendations if labs are abnormal.

## 2022-08-16 IMAGING — CR DG FOOT COMPLETE 3+V*L*
3 series · 3 of 3 positions shown · non-contrast
Comparison: None.

CLINICAL DATA: Pain

EXAM:
LEFT FOOT - COMPLETE 3+ VIEW

[foot ap]
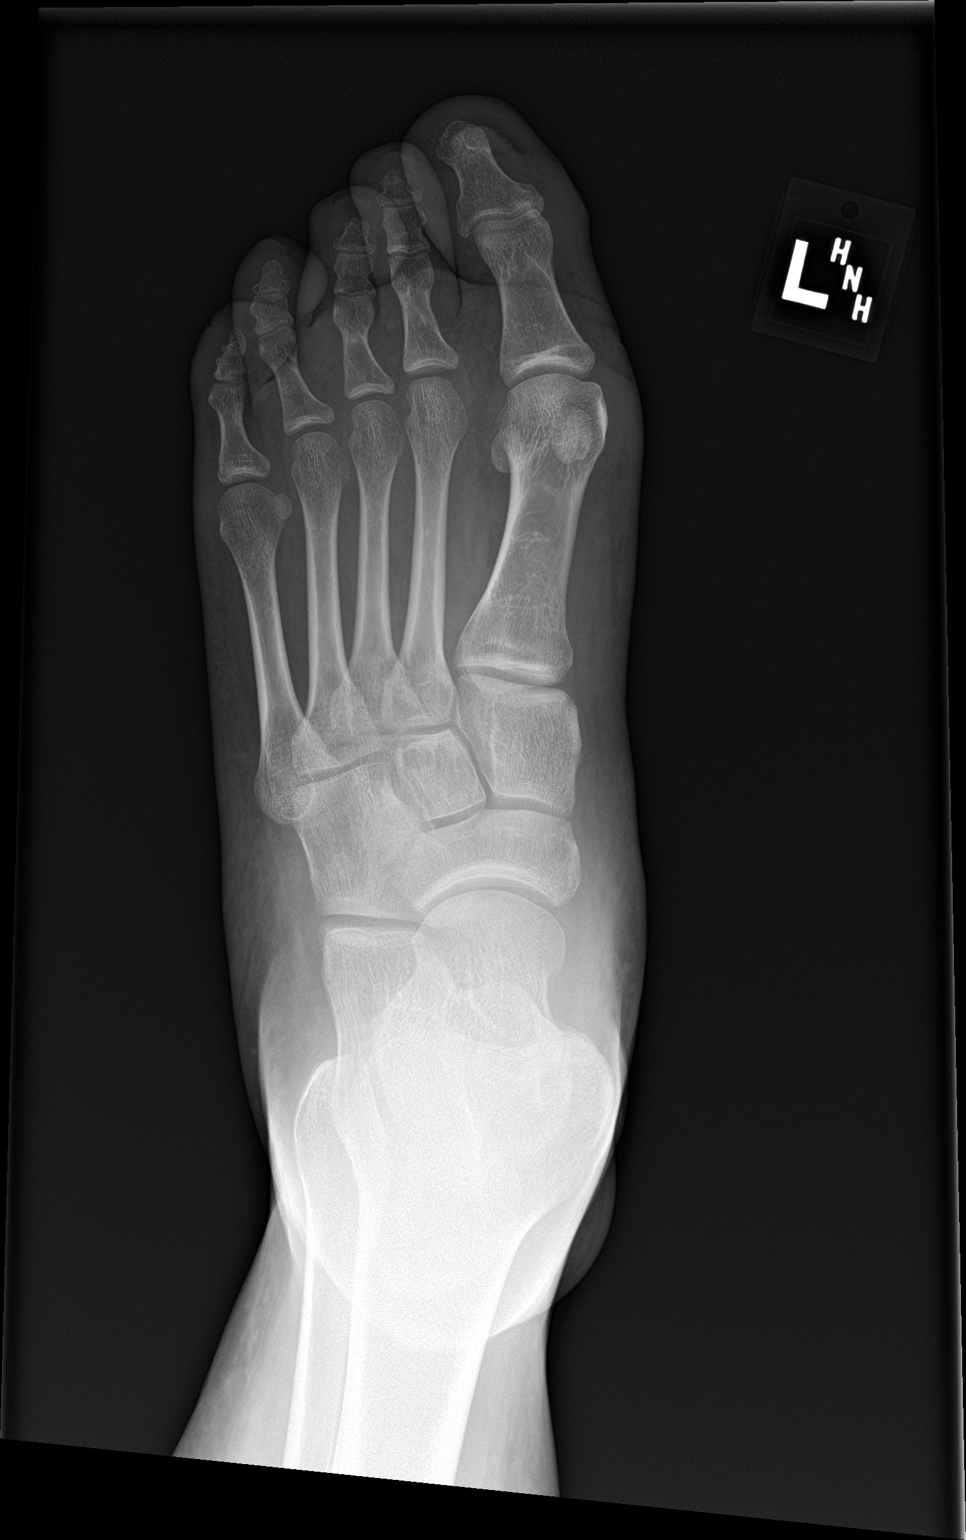

[foot obl]
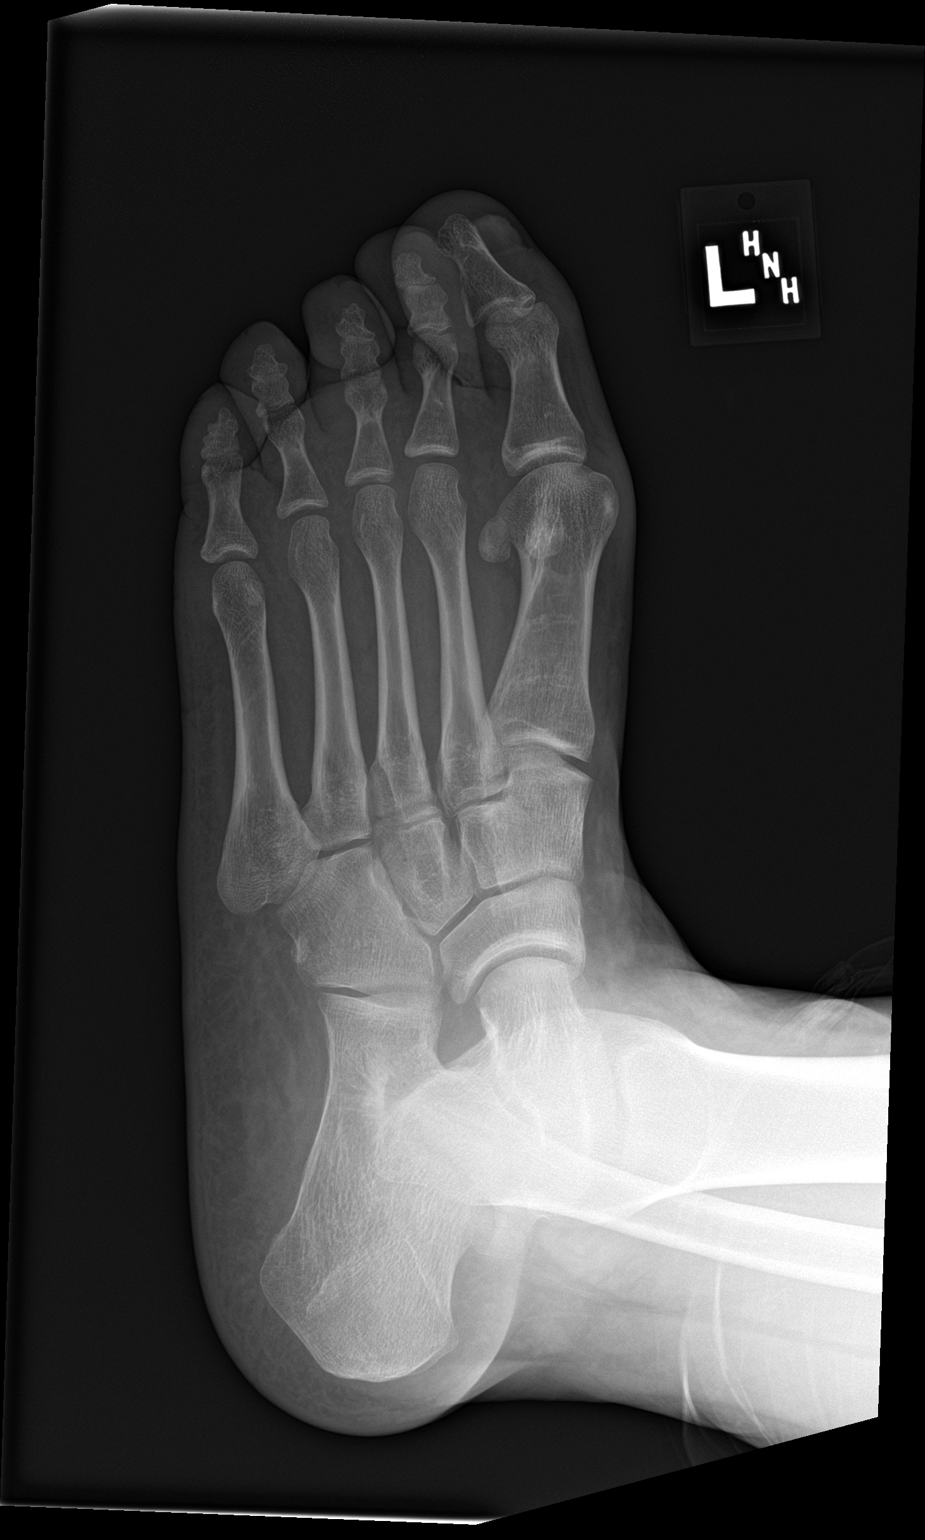

[foot lat]
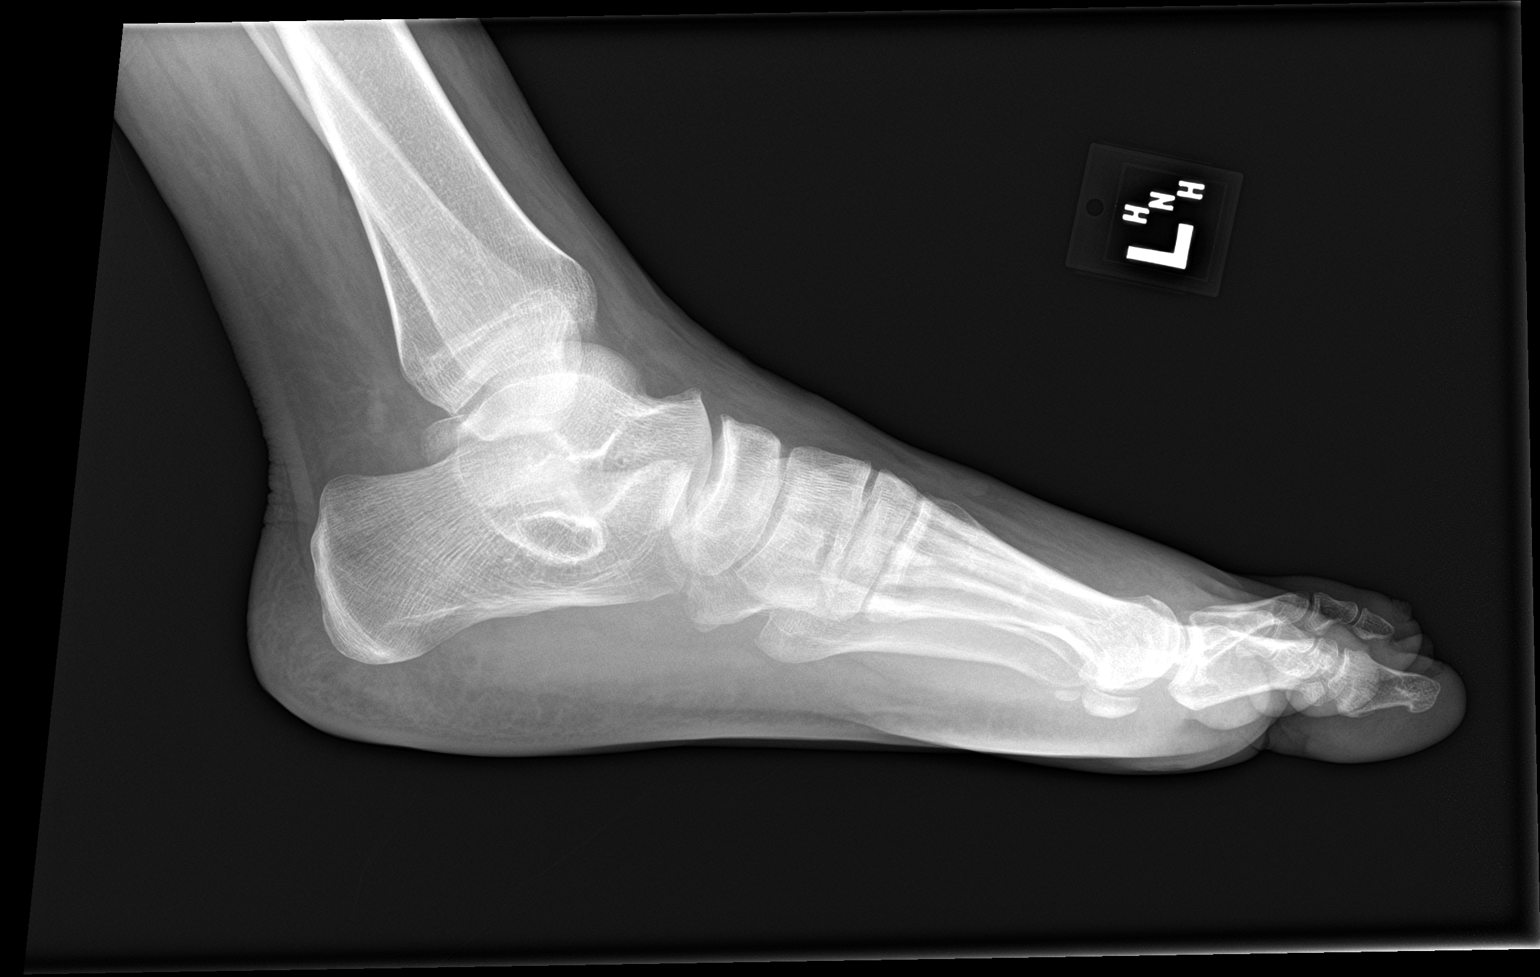

[3 of 3 positions shown; findings below may reference images not displayed]

FINDINGS: There is no evidence of fracture or dislocation. There is no
evidence of arthropathy or other focal bone abnormality. Soft
tissues are unremarkable.
IMPRESSION: Negative.

## 2022-08-20 IMAGING — DX DG FOOT COMPLETE 3+V*L*
3 series · 3 of 3 positions shown · non-contrast
Comparison: None.

CLINICAL DATA: Pain and swelling. Redness across first through
third toes. No known injury.

EXAM:
LEFT FOOT - COMPLETE 3+ VIEW

[foot ap]
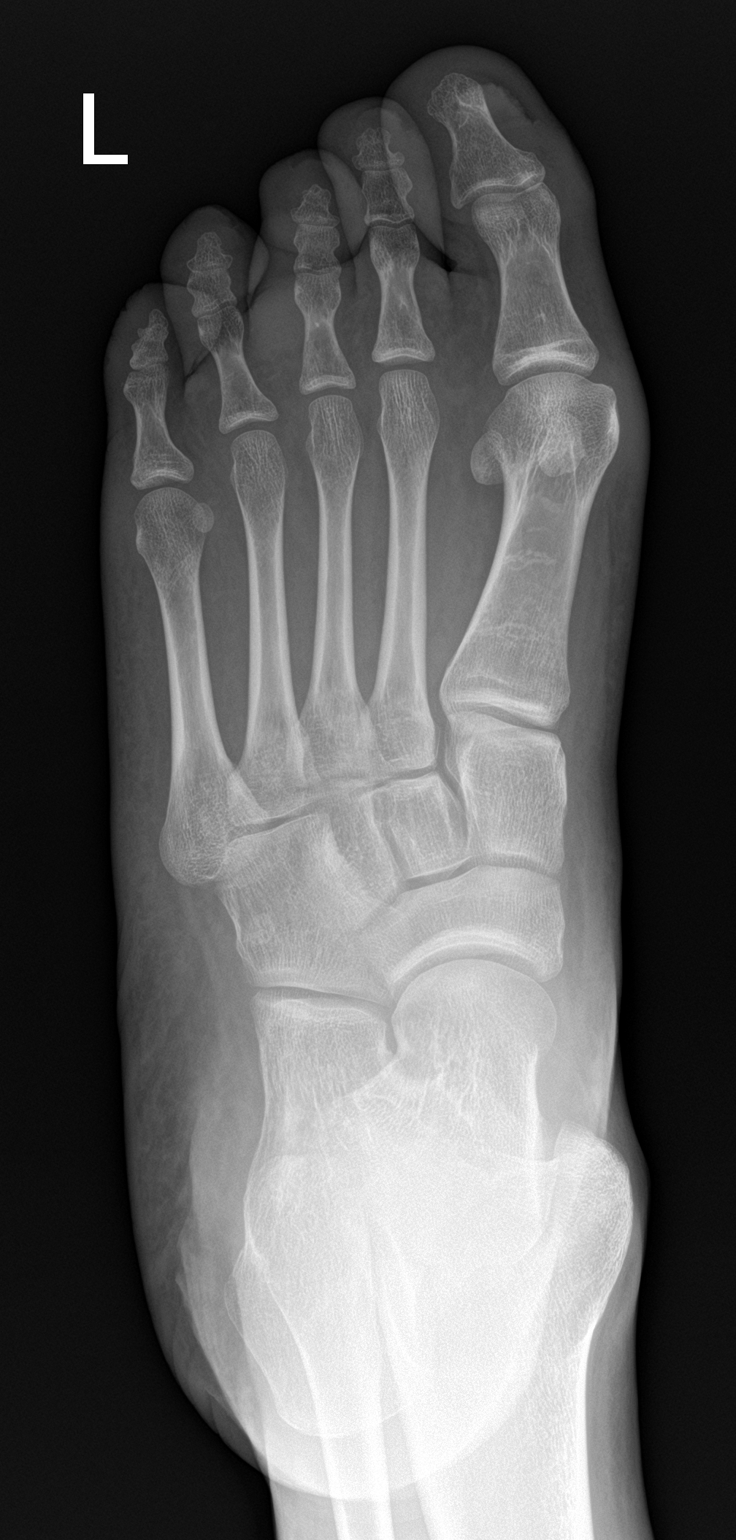

[foot obl]
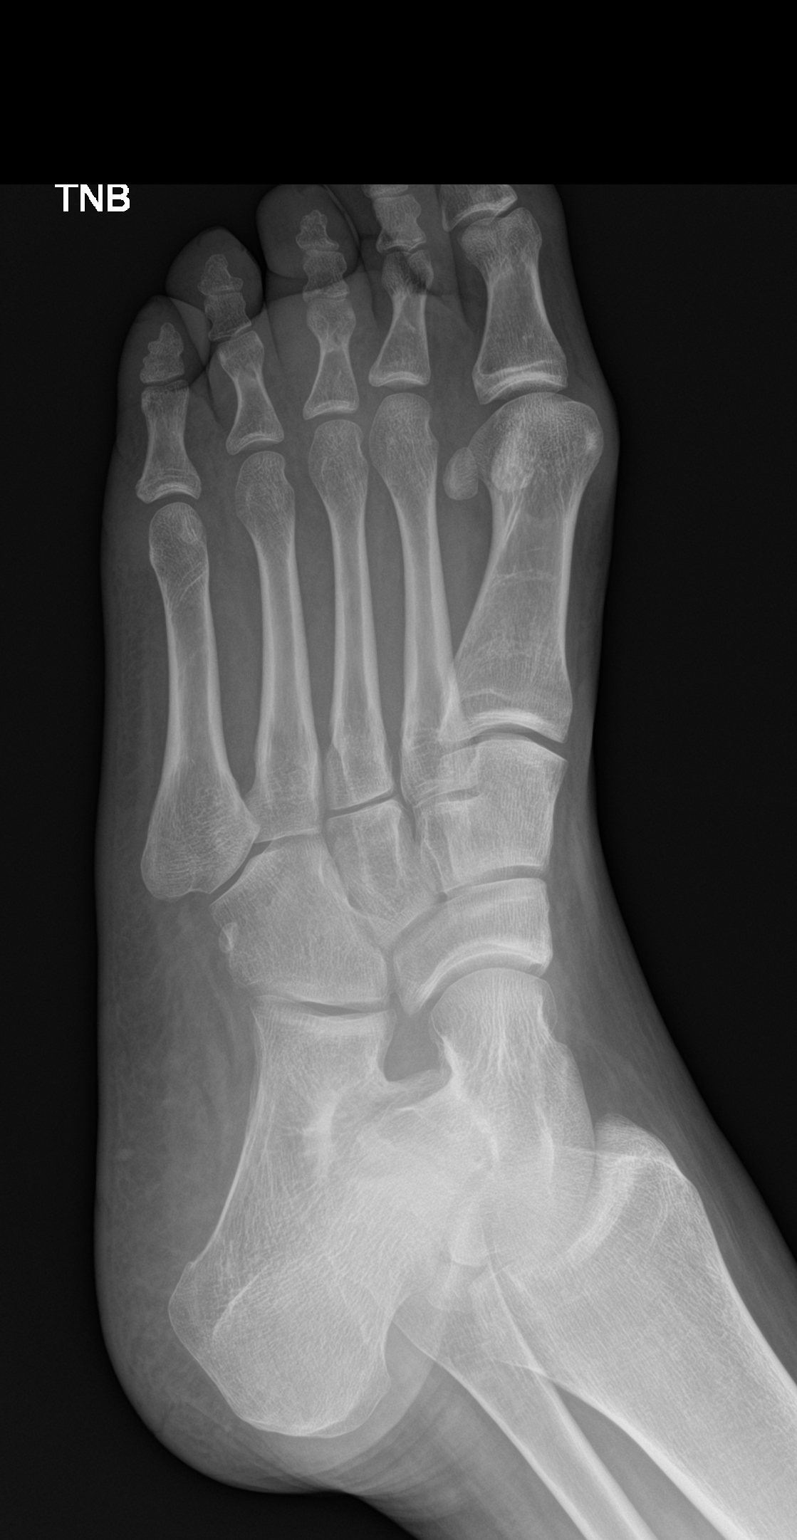

[foot lat]
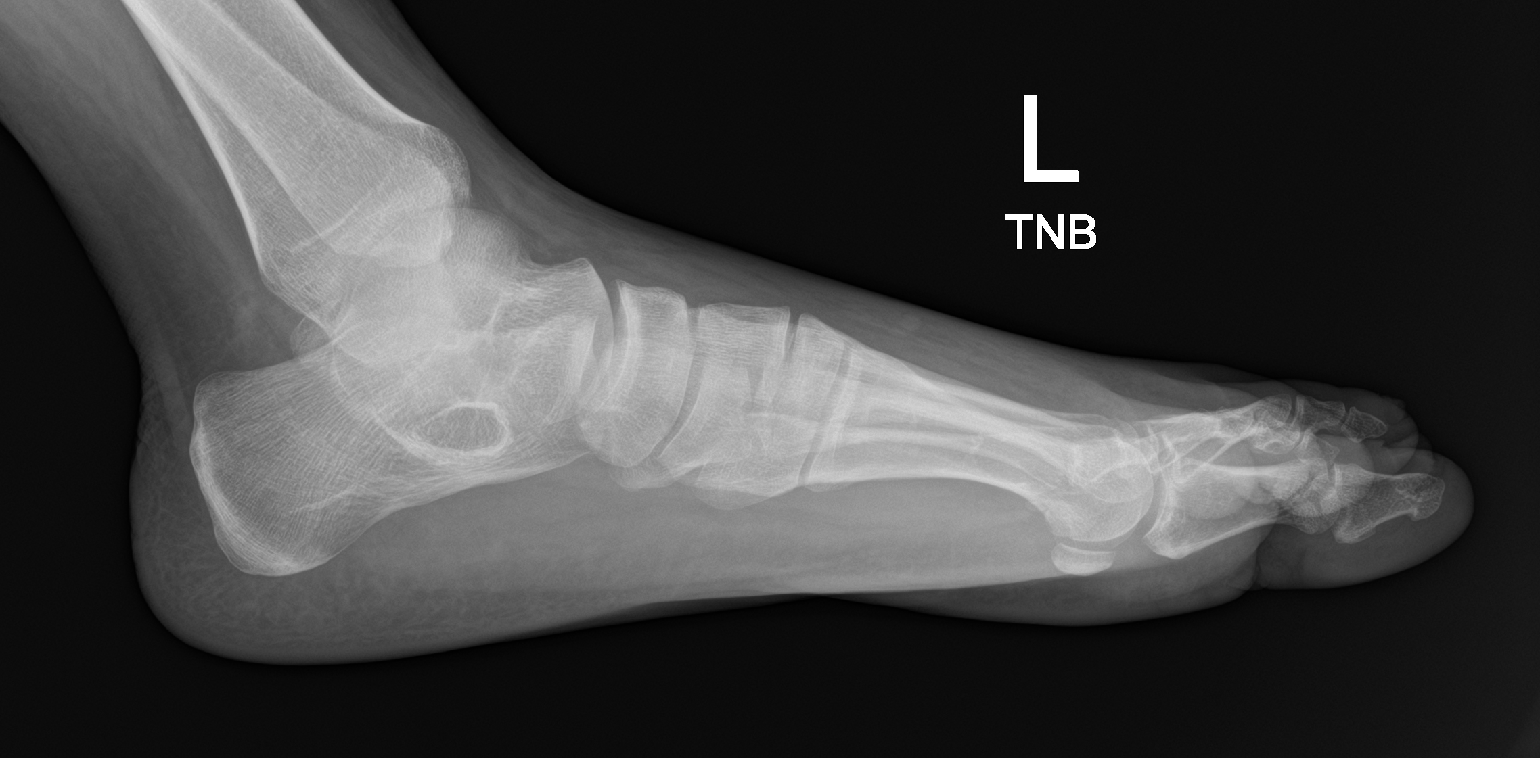

[3 of 3 positions shown; findings below may reference images not displayed]

FINDINGS: There is no evidence of fracture or dislocation. There is no
evidence of arthropathy or other focal bone abnormality. Soft
tissues are unremarkable.
IMPRESSION: Negative.

## 2023-12-15 ENCOUNTER — Other Ambulatory Visit: Payer: Self-pay

## 2023-12-15 ENCOUNTER — Ambulatory Visit (HOSPITAL_COMMUNITY)
Admission: EM | Admit: 2023-12-15 | Discharge: 2023-12-15 | Disposition: A | Discharge status: Home / Self Care | Payer: Self-pay | Attending: Family Medicine | Admitting: Family Medicine

## 2023-12-15 ENCOUNTER — Encounter (HOSPITAL_COMMUNITY): Payer: Self-pay | Admitting: *Deleted

## 2023-12-15 DIAGNOSIS — J00 Acute nasopharyngitis [common cold]: Secondary | ICD-10-CM

## 2023-12-15 DIAGNOSIS — R0981 Nasal congestion: Secondary | ICD-10-CM

## 2023-12-15 MED ORDER — CETIRIZINE-PSEUDOEPHEDRINE ER 5-120 MG PO TB12: 1.0000 | ORAL_TABLET | Freq: Every day | ORAL | 0 refills | Status: AC

## 2023-12-15 NOTE — ED Provider Notes (Signed)
  [facility]   [id number] 12/15/23 Arrival Time: 0813  ASSESSMENT & PLAN:  1. Common cold   2. Nasal congestion    Discussed typical duration of likely viral illness. Work note provided. OTC symptom care as needed.  Meds ordered this encounter  Medications   cetirizine -pseudoephedrine (ZYRTEC -D) 5-120 MG tablet    Sig: Take 1 tablet by mouth daily.    Dispense:  30 tablet    Refill:  0   Pt provided with calendar for mobile care clinic. May f/u here as needed.   Reviewed expectations re: course of current medical issues. Questions answered. Outlined signs and symptoms indicating need for more acute intervention. Understanding verbalized. After Visit Summary given.   SUBJECTIVE: History from: Patient. Lance Russell is a 38 y.o. male. PT reports nasal congestion for2 days. Pt reports he can not sleep at night secondary to nasal congestion. No tx PTA. Denies: fever. Normal PO intake without n/v/d.  OBJECTIVE:  Vitals:   12/15/23 0839  BP: 122/82  Pulse: 82  Resp: 20  Temp: 98.6 F (37 C)  SpO2: 98%    General appearance: alert; no distress Eyes: PERRLA; EOMI; conjunctiva normal HENT: [place]; AT; with nasal congestion Neck: supple  Lungs: speaks full sentences without difficulty; unlabored Extremities: no edema Skin: warm and dry Neurologic: normal gait Psychological: alert and cooperative; normal mood and affect  Labs:  Labs Reviewed - No data to display  Imaging: No results found.  No Known Allergies  History reviewed. No pertinent past medical history. Social History   Socioeconomic History   Marital status: Single    Spouse name: Not on file   Number of children: Not on file   Years of education: Not on file   Highest education level: Not on file  Occupational History   Not on file  Tobacco Use   Smoking status: Every Day   Smokeless tobacco: Never  Substance and Sexual Activity   Alcohol use: No   Drug use: No   Sexual  activity: Not on file  Other Topics Concern   Not on file  Social History Narrative   Not on file   Social Drivers of Health   Financial Resource Strain: Not on file  Food Insecurity: Not on file  Transportation Needs: Not on file  Physical Activity: Not on file  Stress: Not on file  Social Connections: Not on file  Intimate Partner Violence: Not on file   Family History  Family history unknown: Yes   History reviewed. No pertinent surgical history.   Rolinda Rogue, MD 12/15/23 [phone]

## 2023-12-15 NOTE — ED Triage Notes (Signed)
 PT reports nasal congestion for2 days. Pt reports he can not sleep at night.  .the patient reports he is sure he won't get the best help today because he does not have any insurance.

## 2023-12-15 NOTE — ED Notes (Signed)
 PT does not receive calls on his phone. Pt provided with a calendar for Mobile clinic and  [facility] clinic. Pt voiced understanding.
